# Patient Record
Sex: Male | Born: 1995 | Race: Black or African American | Hispanic: No | Marital: Single | Smoking: Current every day smoker
Health system: Southern US, Community
[De-identification: ages and names within clinical notes are randomized; demographics above are authoritative.]

---

## 2019-05-17 ENCOUNTER — Encounter: Payer: Self-pay | Admitting: Emergency Medicine

## 2019-05-17 ENCOUNTER — Emergency Department
Admission: EM | Admit: 2019-05-17 | Discharge: 2019-05-17 | Disposition: A | Discharge status: Home / Self Care | Payer: Self-pay | Attending: Emergency Medicine | Admitting: Emergency Medicine

## 2019-05-17 ENCOUNTER — Emergency Department: Payer: Self-pay

## 2019-05-17 ENCOUNTER — Other Ambulatory Visit: Payer: Self-pay

## 2019-05-17 DIAGNOSIS — F17200 Nicotine dependence, unspecified, uncomplicated: Secondary | ICD-10-CM | POA: Insufficient documentation

## 2019-05-17 DIAGNOSIS — S93402A Sprain of unspecified ligament of left ankle, initial encounter: Secondary | ICD-10-CM | POA: Insufficient documentation

## 2019-05-17 DIAGNOSIS — X509XXA Other and unspecified overexertion or strenuous movements or postures, initial encounter: Secondary | ICD-10-CM | POA: Insufficient documentation

## 2019-05-17 DIAGNOSIS — Y9367 Activity, basketball: Secondary | ICD-10-CM | POA: Insufficient documentation

## 2019-05-17 DIAGNOSIS — Y999 Unspecified external cause status: Secondary | ICD-10-CM | POA: Insufficient documentation

## 2019-05-17 DIAGNOSIS — Y929 Unspecified place or not applicable: Secondary | ICD-10-CM | POA: Insufficient documentation

## 2019-05-17 DIAGNOSIS — Y92310 Basketball court as the place of occurrence of the external cause: Secondary | ICD-10-CM | POA: Insufficient documentation

## 2019-05-17 MED ORDER — TRAMADOL HCL 50 MG PO TABS: 50.0000 mg | ORAL_TABLET | Freq: Four times a day (QID) | ORAL | 0 refills | Status: AC | PRN

## 2019-05-17 MED ORDER — NAPROXEN 500 MG PO TABS: 500.0000 mg | ORAL_TABLET | Freq: Two times a day (BID) | ORAL | 0 refills | Status: AC

## 2019-05-17 NOTE — Discharge Instructions (Signed)
Follow-up with your primary care provider or Dr. Roland Rack who is on-call for orthopedics if any continued problems with your left ankle.  Ice and elevation for the next several days to reduce swelling which will help with pain.  Wear ankle splint for support and protection.  Use crutches when weightbearing until you are able to bear weight without pain.  Begin taking naproxen 500 mg twice daily with food.  Tramadol if needed for pain.  Do not drive or operate machinery while taking tramadol as it could cause drowsiness and increase your risk for injury.

## 2019-05-17 NOTE — ED Provider Notes (Signed)
[facility] Emergency Department Provider Note  ____________________________________________   First MD Initiated Contact with Patient 05/17/19 [phone]     (approximate)  I have reviewed the triage vital signs and the nursing notes.   HISTORY  Chief Complaint Ankle Pain   HPI Juan ClossDeontae Beaston is a 23 y.o. male presents to the ED with complaint of left ankle pain.  Patient states he was playing basketball yesterday and felt a pop and has continued to have swelling and pain since that time.  This morning there is increased pain with weightbearing.  He has taken Tylenol for his pain.  He rates his pain as a 9/10.      History reviewed. No pertinent past medical history.  There are no active problems to display for this patient.   History reviewed. No pertinent surgical history.  Prior to Admission medications   Medication Sig Start Date End Date Taking? Authorizing Provider  naproxen (NAPROSYN) 500 MG tablet Take 1 tablet (500 mg total) by mouth 2 (two) times daily with a meal. 05/17/19   Tommi RumpsSummers, Samit Sylve L, PA-C  traMADol (ULTRAM) 50 MG tablet Take 1 tablet (50 mg total) by mouth every 6 (six) hours as needed. 05/17/19   Tommi RumpsSummers, Kairen Hallinan L, PA-C    Allergies Patient has no known allergies.  No family history on file.  Social History Social History   Tobacco Use  . Smoking status: Current Every Day Smoker  . Smokeless tobacco: Never Used  Substance Use Topics  . Alcohol use: Not Currently  . Drug use: Not on file    Review of Systems Constitutional: No fever/chills Cardiovascular: Denies chest pain. Respiratory: Denies shortness of breath. Gastrointestinal: No abdominal pain.  No nausea, no vomiting.  Musculoskeletal: Positive left ankle pain. Skin: Negative for rash. Neurological: Negative for headaches, focal weakness or numbness. ___________________________________________   PHYSICAL EXAM:  VITAL SIGNS: ED Triage Vitals  Enc Vitals  Group     BP 05/17/19 0848 (!) 151/67     Pulse Rate 05/17/19 0848 61     Resp 05/17/19 0848 18     Temp 05/17/19 0848 97.9 F (36.6 C)     Temp Source 05/17/19 0848 Oral     SpO2 05/17/19 0848 99 %     Weight 05/17/19 0842 230 lb (104.3 kg)     Height 05/17/19 0842 6\' 2"  (1.88 m)     Head Circumference --      Peak Flow --      Pain Score 05/17/19 0842 9     Pain Loc --      Pain Edu? --      Excl. in [place]? --    Constitutional: Alert and oriented. Well appearing and in no acute distress. Eyes: Conjunctivae are normal.  Head: Atraumatic. Neck: No stridor.   Cardiovascular: Normal rate, regular rhythm. Grossly normal heart sounds.  Good peripheral circulation. Respiratory: Normal respiratory effort.  No retractions. Lungs CTAB. Musculoskeletal: On examination of the left ankle there is moderate soft tissue edema present on the lateral aspect.  Range of motion is restricted secondary to discomfort.  Skin is intact with no ecchymosis or abrasions seen.  Patient is able to move digits without any difficulty.  Motor sensory function intact.  Capillary refills less than 3 seconds.  Limping gait was noted to the exam room. Neurologic:  Normal speech and language. No gross focal neurologic deficits are appreciated. Skin:  Skin is warm, dry and intact. No rash noted. Psychiatric: Mood  and affect are normal. Speech and behavior are normal.  ____________________________________________   LABS (all labs ordered are listed, but only abnormal results are displayed)  Labs Reviewed - No data to display  RADIOLOGY  ED MD interpretation:  Left ankle x-ray is negative for acute bony injury.  Official radiology report(s): Dg Ankle Complete Left  Result Date: 05/17/2019 CLINICAL DATA:  Twisting injury playing basketball yesterday. Lateral pain. EXAM: LEFT ANKLE COMPLETE - 3+ VIEW COMPARISON:  None. FINDINGS: The mineralization and alignment are normal. There is no evidence of acute fracture or  dislocation. The joint spaces are preserved. There is moderate lateral soft tissue swelling. IMPRESSION: Moderate lateral soft tissue swelling without evidence of acute fracture or dislocation. Electronically Signed   By: Richardean Sale M.D.   On: 05/17/2019 09:11    ____________________________________________   PROCEDURES  Procedure(s) performed (including Critical Care):  Procedures  Ace wrap and ankle stirrup splint was applied by RN.  Patient was given crutches. ____________________________________________   INITIAL IMPRESSION / ASSESSMENT AND PLAN / ED COURSE  As part of my medical decision making, I reviewed the following data within the electronic MEDICAL RECORD NUMBER Notes from prior ED visits and [place] Controlled Substance Database  23 year old male presents to the ED with complaint of left ankle pain and swelling since yesterday while playing basketball.  Patient reports he heard a pop and has continued to have pain and swelling since that time.  Was noted to have a limping gait.  X-rays were negative for acute bony injury.  Patient was placed in an Ace wrap and a stirrup ankle splint.  He was given crutches.  He was encouraged to ice and elevate.  He is to follow-up with Dr. Roland Rack if any continued problems.  He was discharged with a prescription for naproxen 500 mg twice daily and tramadol every 6 hours as needed for pain.  ____________________________________________   FINAL CLINICAL IMPRESSION(S) / ED DIAGNOSES  Final diagnoses:  Sprain of left ankle, unspecified ligament, initial encounter     ED Discharge Orders         Ordered    naproxen (NAPROSYN) 500 MG tablet  2 times daily with meals     05/17/19 0932    traMADol (ULTRAM) 50 MG tablet  Every 6 hours PRN     05/17/19 0932           Note:  This document was prepared using Dragon voice recognition software and may include unintentional dictation errors.    [name], PA-C 05/17/19 1149     Earleen Newport, MD 05/17/19 [phone]

## 2019-05-17 NOTE — ED Triage Notes (Signed)
Injured left ankle yesterday.

## 2019-09-22 ENCOUNTER — Encounter: Payer: Self-pay | Admitting: Emergency Medicine

## 2019-09-22 DIAGNOSIS — Z20828 Contact with and (suspected) exposure to other viral communicable diseases: Secondary | ICD-10-CM | POA: Insufficient documentation

## 2019-09-22 DIAGNOSIS — Z791 Long term (current) use of non-steroidal anti-inflammatories (NSAID): Secondary | ICD-10-CM | POA: Insufficient documentation

## 2019-09-22 DIAGNOSIS — J039 Acute tonsillitis, unspecified: Secondary | ICD-10-CM | POA: Insufficient documentation

## 2019-09-22 DIAGNOSIS — F17210 Nicotine dependence, cigarettes, uncomplicated: Secondary | ICD-10-CM | POA: Insufficient documentation

## 2019-09-22 NOTE — ED Triage Notes (Signed)
Pt reports he started to have sore throat and swelling to the tonsil areas (bilateral) on Monday. Pt sts, "I know I have tonsillitis cause I have had it before." Both tonsils are swollen and red. Pt denies fever.

## 2019-09-23 ENCOUNTER — Emergency Department
Admission: EM | Admit: 2019-09-23 | Discharge: 2019-09-23 | Disposition: A | Discharge status: Home / Self Care | Payer: Medicaid Other | Attending: Emergency Medicine | Admitting: Emergency Medicine

## 2019-09-23 DIAGNOSIS — J039 Acute tonsillitis, unspecified: Secondary | ICD-10-CM

## 2019-09-23 LAB — POC SARS CORONAVIRUS 2 AG: SARS Coronavirus 2 Ag: NEGATIVE

## 2019-09-23 LAB — GROUP A STREP BY PCR: Group A Strep by PCR: NOT DETECTED

## 2019-09-23 MED ORDER — AMOXICILLIN-POT CLAVULANATE 875-125 MG PO TABS: 1.0000 | ORAL_TABLET | Freq: Two times a day (BID) | ORAL | 0 refills | Status: AC

## 2019-09-23 MED ORDER — AMOXICILLIN-POT CLAVULANATE 875-125 MG PO TABS
1.0000 | ORAL_TABLET | Freq: Once | ORAL | Status: AC
  Administered 2019-09-23: 02:00:00 1 via ORAL
  Filled 2019-09-23: qty 1

## 2019-09-23 MED ORDER — PREDNISONE 20 MG PO TABS: 60.0000 mg | ORAL_TABLET | Freq: Every day | ORAL | 0 refills | Status: AC

## 2019-09-23 MED ORDER — PREDNISONE 20 MG PO TABS
60.0000 mg | ORAL_TABLET | Freq: Once | ORAL | Status: AC
  Administered 2019-09-23: 60 mg via ORAL
  Filled 2019-09-23: qty 3

## 2019-09-23 NOTE — ED Provider Notes (Signed)
[facility] Emergency Department Provider Note  ____________________________________________   First MD Initiated Contact with Patient 09/23/19 0018     (approximate)  I have reviewed the triage vital signs and the nursing notes.   HISTORY  Chief Complaint Oral Swelling and Sore Throat    HPI Juan Bryant is a 23 y.o. male with past medical history of tonsillitis presents to the emergency department secondary to sore throat and bilateral tonsillar swelling x3 days.  Patient denies any fever.  Patient denies any cough.  Patient states that symptoms consistent with previous episodes of tonsillitis.        History reviewed. No pertinent past medical history.  There are no problems to display for this patient.   History reviewed. No pertinent surgical history.  Prior to Admission medications   Medication Sig Start Date End Date Taking? Authorizing Provider  amoxicillin-clavulanate (AUGMENTIN) 875-125 MG tablet Take 1 tablet by mouth every 12 (twelve) hours for 10 days. 09/23/19 10/03/19  Gregor Hams, MD  naproxen (NAPROSYN) 500 MG tablet Take 1 tablet (500 mg total) by mouth 2 (two) times daily with a meal. 05/17/19   [name], PA-C  predniSONE (DELTASONE) 20 MG tablet Take 3 tablets (60 mg total) by mouth daily for 5 days. 09/23/19 09/28/19  Gregor Hams, MD  traMADol (ULTRAM) 50 MG tablet Take 1 tablet (50 mg total) by mouth every 6 (six) hours as needed. 05/17/19   [name], PA-C    Allergies Patient has no known allergies.  History reviewed. No pertinent family history.  Social History Social History   Tobacco Use  . Smoking status: Current Every Day Smoker  . Smokeless tobacco: Never Used  Substance Use Topics  . Alcohol use: Not Currently  . Drug use: Not on file    Review of Systems Constitutional: No fever/chills Eyes: No visual changes. ENT: Positive for sore throat. Cardiovascular: Denies chest  pain. Respiratory: Denies shortness of breath. Gastrointestinal: No abdominal pain.  No nausea, no vomiting.  No diarrhea.  No constipation. Genitourinary: Negative for dysuria. Musculoskeletal: Negative for neck pain.  Negative for back pain. Integumentary: Negative for rash. Neurological: Negative for headaches, focal weakness or numbness.   ____________________________________________   PHYSICAL EXAM:  VITAL SIGNS: ED Triage Vitals [09/22/19 2258]  Enc Vitals Group     BP (!) 150/76     Pulse Rate 78     Resp 18     Temp 99.8 F (37.7 C)     Temp Source Oral     SpO2 100 %     Weight      Height      Head Circumference      Peak Flow      Pain Score      Pain Loc      Pain Edu?      Excl. in [place]?     Constitutional: Alert and oriented.  Eyes: Conjunctivae are normal.  Nose: No congestion/rhinnorhea. Mouth/Throat: Patient is wearing a mask. Neck: No stridor.  Pharyngeal erythema no exudate noted Cardiovascular: Normal rate, regular rhythm. Good peripheral circulation. Grossly normal heart sounds. Respiratory: Normal respiratory effort.  No retractions. Gastrointestinal: Soft and nontender. No distention.   Musculoskeletal: No lower extremity tenderness nor edema. No gross deformities of extremities. Neurologic:  Normal speech and language. No gross focal neurologic deficits are appreciated.  Skin:  Skin is warm, dry and intact. Psychiatric: Mood and affect are normal. Speech and behavior are normal.  ____________________________________________   LABS (all labs ordered are listed, but only abnormal results are displayed)  Labs Reviewed  GROUP A STREP BY PCR  POC SARS CORONAVIRUS 2 AG -  ED  POC SARS CORONAVIRUS 2 AG    No results found.  Procedures   ____________________________________________   INITIAL IMPRESSION / MDM / ASSESSMENT AND PLAN / ED COURSE  As part of my medical decision making, I reviewed the following data within the electronic  MEDICAL RECORD NUMBER   23 year old male presented with above-stated history and physical exam consistent with tonsillitis without any evidence of peritonsillar abscess.  Patient given Augmentin and prednisone in the emergency department will be prescribed the same for home.  Covid testing was performed which was negative group A strep was also performed which was also negative.  Patient handling oral secretions without any difficulty.  Spoke with the patient at length regarding warning signs that would warrant immediate return to the emergency department.      ____________________________________________  FINAL CLINICAL IMPRESSION(S) / ED DIAGNOSES  Final diagnoses:  Tonsillitis     MEDICATIONS GIVEN DURING THIS VISIT:  Medications  predniSONE (DELTASONE) tablet 60 mg (has no administration in time range)  amoxicillin-clavulanate (AUGMENTIN) 875-125 MG per tablet 1 tablet (has no administration in time range)     ED Discharge Orders         Ordered    amoxicillin-clavulanate (AUGMENTIN) 875-125 MG tablet  Every 12 hours     09/23/19 0126    predniSONE (DELTASONE) 20 MG tablet  Daily     09/23/19 0126          *Please note:  Juan Bryant was evaluated in Emergency Department on 09/23/2019 for the symptoms described in the history of present illness. He was evaluated in the context of the global COVID-19 pandemic, which necessitated consideration that the patient might be at risk for infection with the SARS-CoV-2 virus that causes COVID-19. Institutional protocols and algorithms that pertain to the evaluation of patients at risk for COVID-19 are in a state of rapid change based on information released by regulatory bodies including the CDC and federal and state organizations. These policies and algorithms were followed during the patient's care in the ED.  Some ED evaluations and interventions may be delayed as a result of limited staffing during the pandemic.*  Note:  This document  was prepared using Dragon voice recognition software and may include unintentional dictation errors.   Darci Current, MD 09/23/19 [phone]

## 2020-01-19 ENCOUNTER — Encounter: Payer: Self-pay | Admitting: *Deleted

## 2020-01-19 ENCOUNTER — Emergency Department
Admission: EM | Admit: 2020-01-19 | Discharge: 2020-01-19 | Disposition: A | Discharge status: Home / Self Care | Payer: Medicaid Other | Attending: Emergency Medicine | Admitting: Emergency Medicine

## 2020-01-19 ENCOUNTER — Emergency Department: Payer: Medicaid Other

## 2020-01-19 ENCOUNTER — Other Ambulatory Visit: Payer: Self-pay

## 2020-01-19 DIAGNOSIS — Z79899 Other long term (current) drug therapy: Secondary | ICD-10-CM | POA: Insufficient documentation

## 2020-01-19 DIAGNOSIS — Z791 Long term (current) use of non-steroidal anti-inflammatories (NSAID): Secondary | ICD-10-CM | POA: Insufficient documentation

## 2020-01-19 DIAGNOSIS — F17210 Nicotine dependence, cigarettes, uncomplicated: Secondary | ICD-10-CM | POA: Insufficient documentation

## 2020-01-19 DIAGNOSIS — R002 Palpitations: Secondary | ICD-10-CM | POA: Insufficient documentation

## 2020-01-19 DIAGNOSIS — R0789 Other chest pain: Secondary | ICD-10-CM

## 2020-01-19 LAB — BASIC METABOLIC PANEL
Anion gap: 8 (ref 5–15)
BUN: 11 mg/dL (ref 6–20)
CO2: 26 mmol/L (ref 22–32)
Calcium: 9.5 mg/dL (ref 8.9–10.3)
Chloride: 105 mmol/L (ref 98–111)
Creatinine, Ser: 1.09 mg/dL (ref 0.61–1.24)
GFR calc Af Amer: >60 mL/min (ref >60)
GFR calc non Af Amer: >60 mL/min (ref >60)
Glucose, Bld: 98 mg/dL (ref 70–99)
Potassium: 4.2 mmol/L (ref 3.5–5.1)
Sodium: 139 mmol/L (ref 135–145)

## 2020-01-19 LAB — CBC
HCT: 40.4 % (ref 39.0–52.0)
Hemoglobin: 14.2 g/dL (ref 13.0–17.0)
MCH: 32.5 pg (ref 26.0–34.0)
MCHC: 35.1 g/dL (ref 30.0–36.0)
MCV: 92.4 fL (ref 80.0–100.0)
Platelets: 245 10*3/uL (ref 150–400)
RBC: 4.37 MIL/uL (ref 4.22–5.81)
RDW: 12.8 % (ref 11.5–15.5)
WBC: 7.7 10*3/uL (ref 4.0–10.5)
nRBC: 0 % (ref 0.0–0.2)

## 2020-01-19 LAB — TROPONIN I (HIGH SENSITIVITY): Troponin I (High Sensitivity): 3 ng/L (ref <18)

## 2020-01-19 MED ORDER — SUCRALFATE 1 G PO TABS: 1.0000 g | ORAL_TABLET | Freq: Three times a day (TID) | ORAL | 0 refills | Status: AC

## 2020-01-19 MED ORDER — LIDOCAINE VISCOUS HCL 2 % MT SOLN
15.0000 mL | Freq: Once | OROMUCOSAL | Status: AC
  Administered 2020-01-19: 19:00:00 15 mL via ORAL
  Filled 2020-01-19: qty 15

## 2020-01-19 MED ORDER — ALUM & MAG HYDROXIDE-SIMETH 200-200-20 MG/5ML PO SUSP
30.0000 mL | Freq: Once | ORAL | Status: AC
  Administered 2020-01-19: 19:00:00 30 mL via ORAL
  Filled 2020-01-19: qty 30

## 2020-01-19 MED ORDER — SODIUM CHLORIDE 0.9% FLUSH: 3.0000 mL | Freq: Once | INTRAVENOUS | Status: DC

## 2020-01-19 MED ORDER — PANTOPRAZOLE SODIUM 40 MG PO TBEC: 40.0000 mg | DELAYED_RELEASE_TABLET | Freq: Every day | ORAL | 0 refills | Status: AC

## 2020-01-19 NOTE — Discharge Instructions (Addendum)
Your symptoms of palpitations are actually likely related to your stomach and acid reflux.  START taking the prescribed antacids  STOP smoking  AVOID foods high in fat, fried foods, or foods with high acid content or spice  MINIMIZE any eating or meals after 8 PM  If your symptoms do not improve, call to set up a CARDIOLOGY appointment in the next 1-2 weeks  AVOID alcohol

## 2020-01-19 NOTE — ED Triage Notes (Signed)
Pt ambulatory to triage.  Pt reports chest pain and sob for 2 weeks.  Reports heart beating fast at times.  No n/v/   Sob with exertion.  Pt alert  Speech clear.  Seen at [facility] recently for similar sx.

## 2020-01-19 NOTE — ED Provider Notes (Signed)
[facility] Emergency Department Provider Note  ____________________________________________   First MD Initiated Contact with Patient 01/19/20 1805     (approximate)  I have reviewed the triage vital signs and the nursing notes.   HISTORY  Chief Complaint Chest Pain    HPI Juan Bryant is a 24 y.o. male here with intermittent palpitations.  The patient states that off and on for the last several weeks, he has had episodes in which he begins to feel like he has palpitations and like his heart is beating funny.  He has an associated mild pressure-like sensation in his epigastric and substernal area.  The symptoms seem to come and go randomly.  He does notice they are slightly worse at night and after eating a large meal.  He also has some occasional acid taste in his mouth with these.  He states that when he feels a feeling, he does have some mild shortness of breath.  He is taking his pulse during the episodes and has not noticed that it has been fast or irregular.  He has a history of what he reports is possible abnormal rhythm as a child, but this was cleared by cardiology.  No personal family history of early heart disease or arrhythmia.        No past medical history on file.  There are no problems to display for this patient.   No past surgical history on file.  Prior to Admission medications   Medication Sig Start Date End Date Taking? Authorizing Provider  naproxen (NAPROSYN) 500 MG tablet Take 1 tablet (500 mg total) by mouth 2 (two) times daily with a meal. 05/17/19   Letitia Neri L, PA-C  pantoprazole (PROTONIX) 40 MG tablet Take 1 tablet (40 mg total) by mouth daily for 14 days. 01/19/20 02/02/20  Duffy Bruce, MD  sucralfate (CARAFATE) 1 g tablet Take 1 tablet (1 g total) by mouth 4 (four) times daily -  with meals and at bedtime for 5 days. 01/19/20 01/24/20  Duffy Bruce, MD  traMADol (ULTRAM) 50 MG tablet Take 1 tablet (50 mg total) by  mouth every 6 (six) hours as needed. 05/17/19   [name], PA-C    Allergies Patient has no known allergies.  No family history on file.  Social History Social History   Tobacco Use  . Smoking status: Current Every Day Smoker  . Smokeless tobacco: Never Used  Substance Use Topics  . Alcohol use: Not Currently  . Drug use: Not on file    Review of Systems  Review of Systems  Constitutional: Negative for chills, fatigue and fever.  HENT: Negative for sore throat.   Respiratory: Negative for shortness of breath.   Cardiovascular: Positive for palpitations. Negative for chest pain.  Gastrointestinal: Positive for nausea. Negative for abdominal pain.  Genitourinary: Negative for flank pain.  Musculoskeletal: Negative for neck pain.  Skin: Negative for rash and wound.  Allergic/Immunologic: Negative for immunocompromised state.  Neurological: Negative for weakness and numbness.  Hematological: Does not bruise/bleed easily.  All other systems reviewed and are negative.    ____________________________________________  PHYSICAL EXAM:      VITAL SIGNS: ED Triage Vitals  Enc Vitals Group     BP 01/19/20 1601 (!) 154/85     Pulse Rate 01/19/20 1601 63     Resp 01/19/20 1601 18     Temp 01/19/20 1601 98.9 F (37.2 C)     Temp Source 01/19/20 1601 Oral  SpO2 01/19/20 1601 99 %     Weight 01/19/20 1558 260 lb (117.9 kg)     Height 01/19/20 1558 6\' 2"  (1.88 m)     Head Circumference --      Peak Flow --      Pain Score 01/19/20 1558 5     Pain Loc --      Pain Edu? --      Excl. in [place]? --      Physical Exam Vitals and nursing note reviewed.  Constitutional:      General: He is not in acute distress.    Appearance: He is well-developed.  HENT:     Head: Normocephalic and atraumatic.  Eyes:     Conjunctiva/sclera: Conjunctivae normal.  Cardiovascular:     Rate and Rhythm: Normal rate and regular rhythm.     Heart sounds: Normal heart sounds. No murmur.  No friction rub.  Pulmonary:     Effort: Pulmonary effort is normal. No respiratory distress.     Breath sounds: Normal breath sounds. No wheezing or rales.  Abdominal:     General: There is no distension.     Palpations: Abdomen is soft.     Tenderness: There is no abdominal tenderness.     Comments: Mild, epigastric  Musculoskeletal:     Cervical back: Neck supple.  Skin:    General: Skin is warm.     Capillary Refill: Capillary refill takes less than 2 seconds.  Neurological:     Mental Status: He is alert and oriented to person, place, and time.     Motor: No abnormal muscle tone.       ____________________________________________   LABS (all labs ordered are listed, but only abnormal results are displayed)  Labs Reviewed  BASIC METABOLIC PANEL  CBC  TROPONIN I (HIGH SENSITIVITY)  TROPONIN I (HIGH SENSITIVITY)    ____________________________________________  EKG: Normal sinus rhythm, ventricular rate 61.  PR 148, QRS 104, QTc 390.  Occasional PACs noted on second EKG.  First EKG is unremarkable.  No evidence of acute ischemia or infarct. ________________________________________  RADIOLOGY All imaging, including plain films, CT scans, and ultrasounds, independently reviewed by me, and interpretations confirmed via formal radiology reads.  ED MD interpretation:   Chest x-ray: Clear  Official radiology report(s): DG Chest 2 View  Result Date: 01/19/2020 CLINICAL DATA:  Chest pain and shortness of breath EXAM: CHEST - 2 VIEW COMPARISON:  None. FINDINGS: No consolidation, features of edema, pneumothorax, or effusion. Pulmonary vascularity is normally distributed. The cardiomediastinal contours are unremarkable. No acute osseous or soft tissue abnormality. Degenerative changes are present in the imaged spine and shoulders. IMPRESSION: No acute cardiopulmonary abnormality. Electronically Signed   By: 01/21/2020 M.D.   On: 01/19/2020 16:42     ____________________________________________  PROCEDURES   Procedure(s) performed (including Critical Care):  Procedures  ____________________________________________  INITIAL IMPRESSION / MDM / ASSESSMENT AND PLAN / ED COURSE  As part of my medical decision making, I reviewed the following data within the electronic MEDICAL RECORD NUMBER Nursing notes reviewed and incorporated, Old chart reviewed, Notes from prior ED visits, and [place] Controlled Substance Database       *Dee Maday was evaluated in Emergency Department on 01/19/2020 for the symptoms described in the history of present illness. He was evaluated in the context of the global COVID-19 pandemic, which necessitated consideration that the patient might be at risk for infection with the SARS-CoV-2 virus that causes COVID-19. Institutional protocols and algorithms  that pertain to the evaluation of patients at risk for COVID-19 are in a state of rapid change based on information released by regulatory bodies including the CDC and federal and state organizations. These policies and algorithms were followed during the patient's care in the ED.  Some ED evaluations and interventions may be delayed as a result of limited staffing during the pandemic.*     Medical Decision Making: 24 year old male here with occasional palpitations and epigastric pain.  This is been ongoing for weeks.  On arrival, EKG is nonischemic and he has no evidence to suggest significant ischemia clinically.  Troponin negative despite constant symptoms greater than 6 hours, making ACS highly unlikely.  Pain is not consistent with PE.  Of note, the symptoms seem to occur worse at night, occasionally related to eating, and are reproducible with epigastric palpation.  I suspect he may have a component of GERD with esophagitis which she is feeling his palpitations.  He had palpitation symptoms in the ED, with stable pulse on palpation and improvement with GI cocktail.  He  does have some occasional PACs on his repeat EKG, though I would not suspect this to cause significant symptoms.  Based on shared decision making, will treat with course of antacids.  If this does not improve his symptoms, will refer to cardiology for possible outpatient Holter monitoring.  ____________________________________________  FINAL CLINICAL IMPRESSION(S) / ED DIAGNOSES  Final diagnoses:  Atypical chest pain  Palpitations     MEDICATIONS GIVEN DURING THIS VISIT:  Medications  alum & mag hydroxide-simeth (MAALOX/MYLANTA) 200-200-20 MG/5ML suspension 30 mL (30 mLs Oral Given 01/19/20 1844)    And  lidocaine (XYLOCAINE) 2 % viscous mouth solution 15 mL (15 mLs Oral Given 01/19/20 1844)     ED Discharge Orders         Ordered    pantoprazole (PROTONIX) 40 MG tablet  Daily     01/19/20 2005    sucralfate (CARAFATE) 1 g tablet  3 times daily with meals & bedtime     01/19/20 2005           Note:  This document was prepared using Dragon voice recognition software and may include unintentional dictation errors.   Shaune Pollack, MD 01/19/20 2123

## 2021-03-01 IMAGING — DX LEFT ANKLE COMPLETE - 3+ VIEW
3 series · 3 of 3 positions shown · non-contrast
Comparison: None.

CLINICAL DATA: Twisting injury playing basketball yesterday.
Lateral pain.

EXAM:
LEFT ANKLE COMPLETE - 3+ VIEW

[ankle ap]
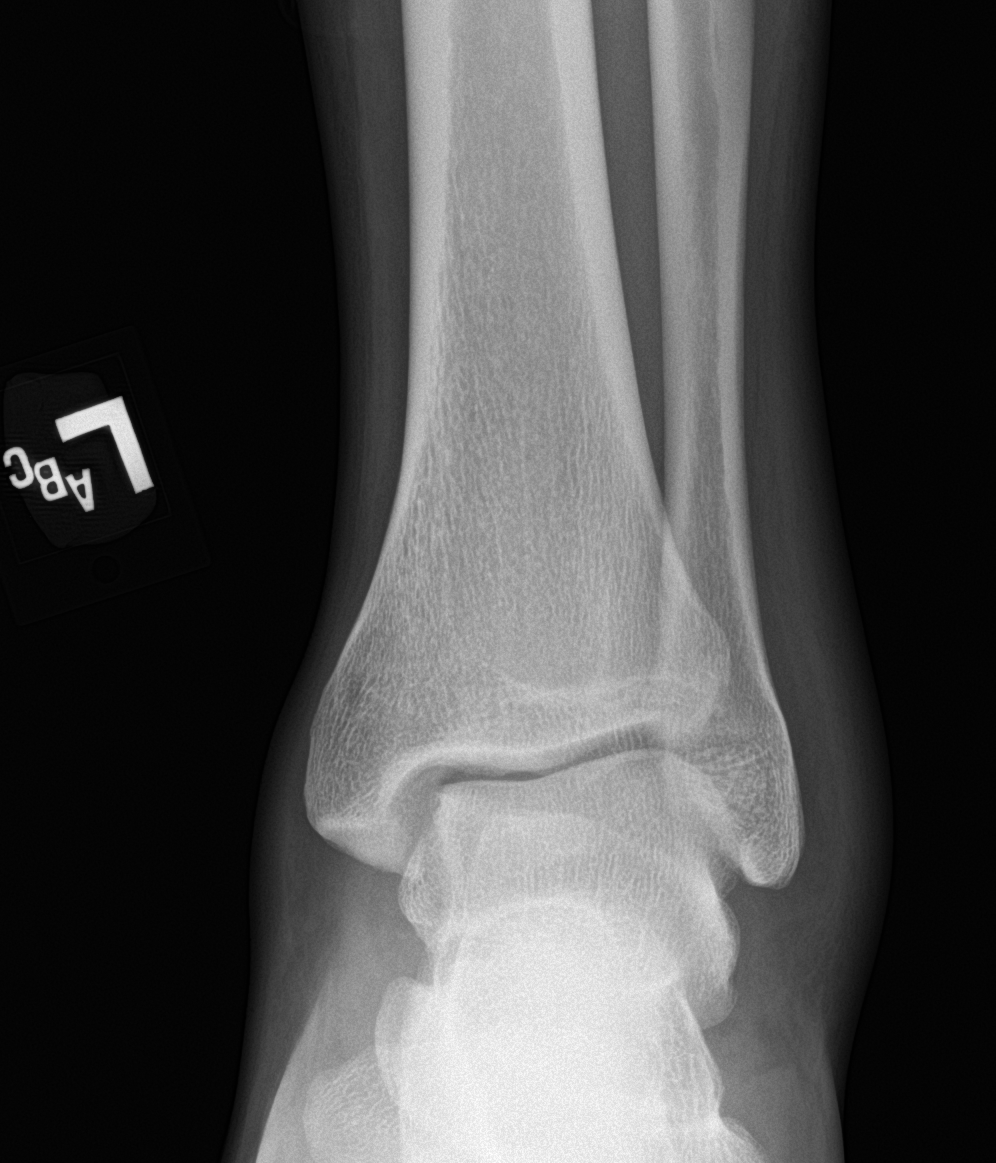

[ankle obl]
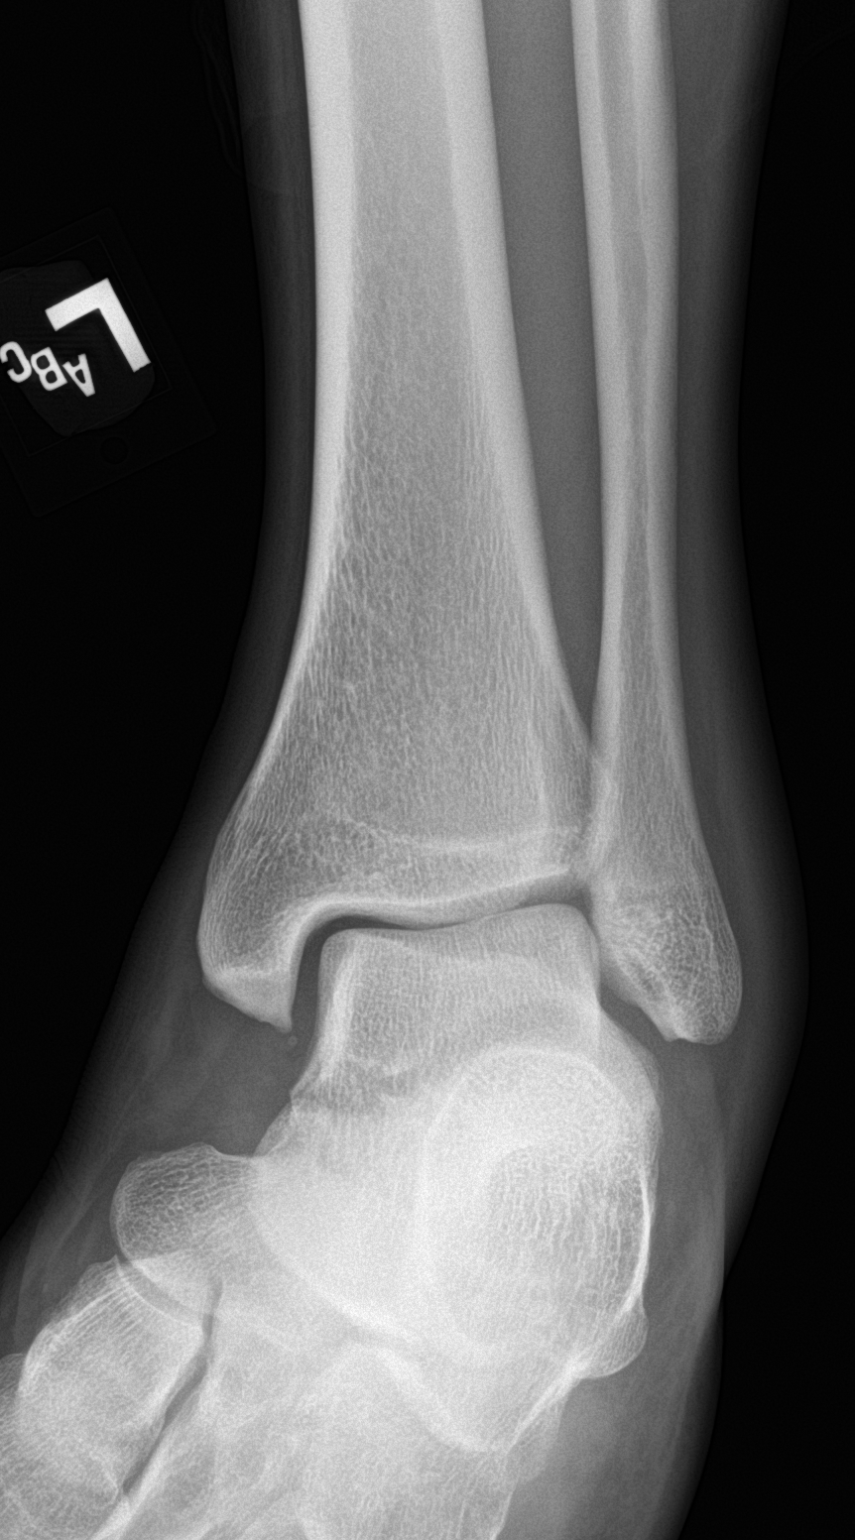

[ankle lat]
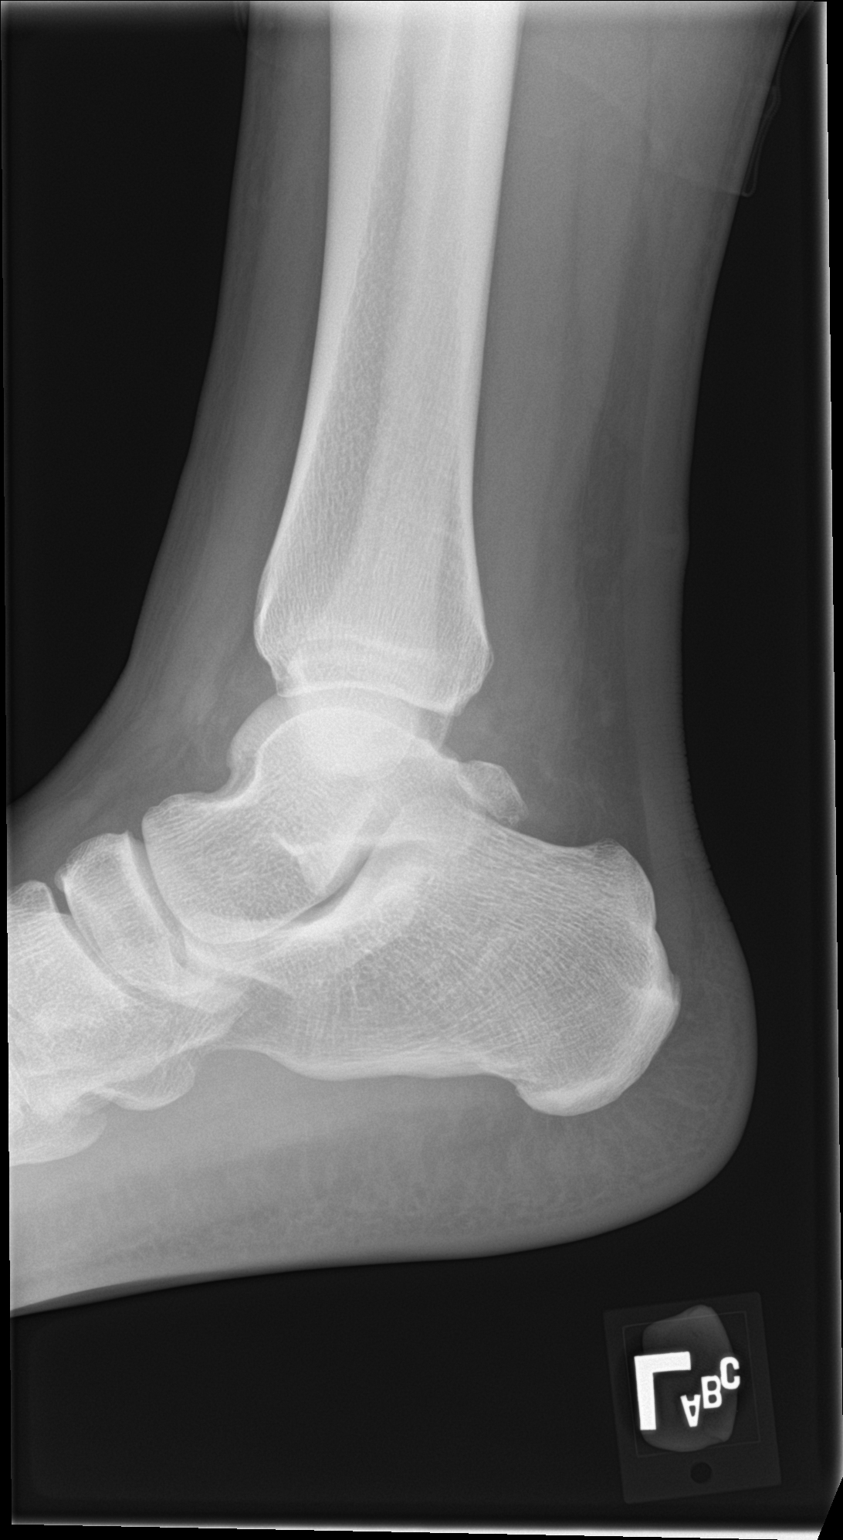

[3 of 3 positions shown; findings below may reference images not displayed]

FINDINGS: The mineralization and alignment are normal. There is no evidence of
acute fracture or dislocation. The joint spaces are preserved. There
is moderate lateral soft tissue swelling.
IMPRESSION: Moderate lateral soft tissue swelling without evidence of acute
fracture or dislocation.

## 2021-11-03 IMAGING — CR DG CHEST 2V
1 series · 2 of 2 positions shown · non-contrast
Comparison: None.

CLINICAL DATA: Chest pain and shortness of breath

EXAM:
CHEST - 2 VIEW

[Series 1: dg chest 2 view · 0.14mm/px · 2 of 2 slices shown]
[im 1/2]
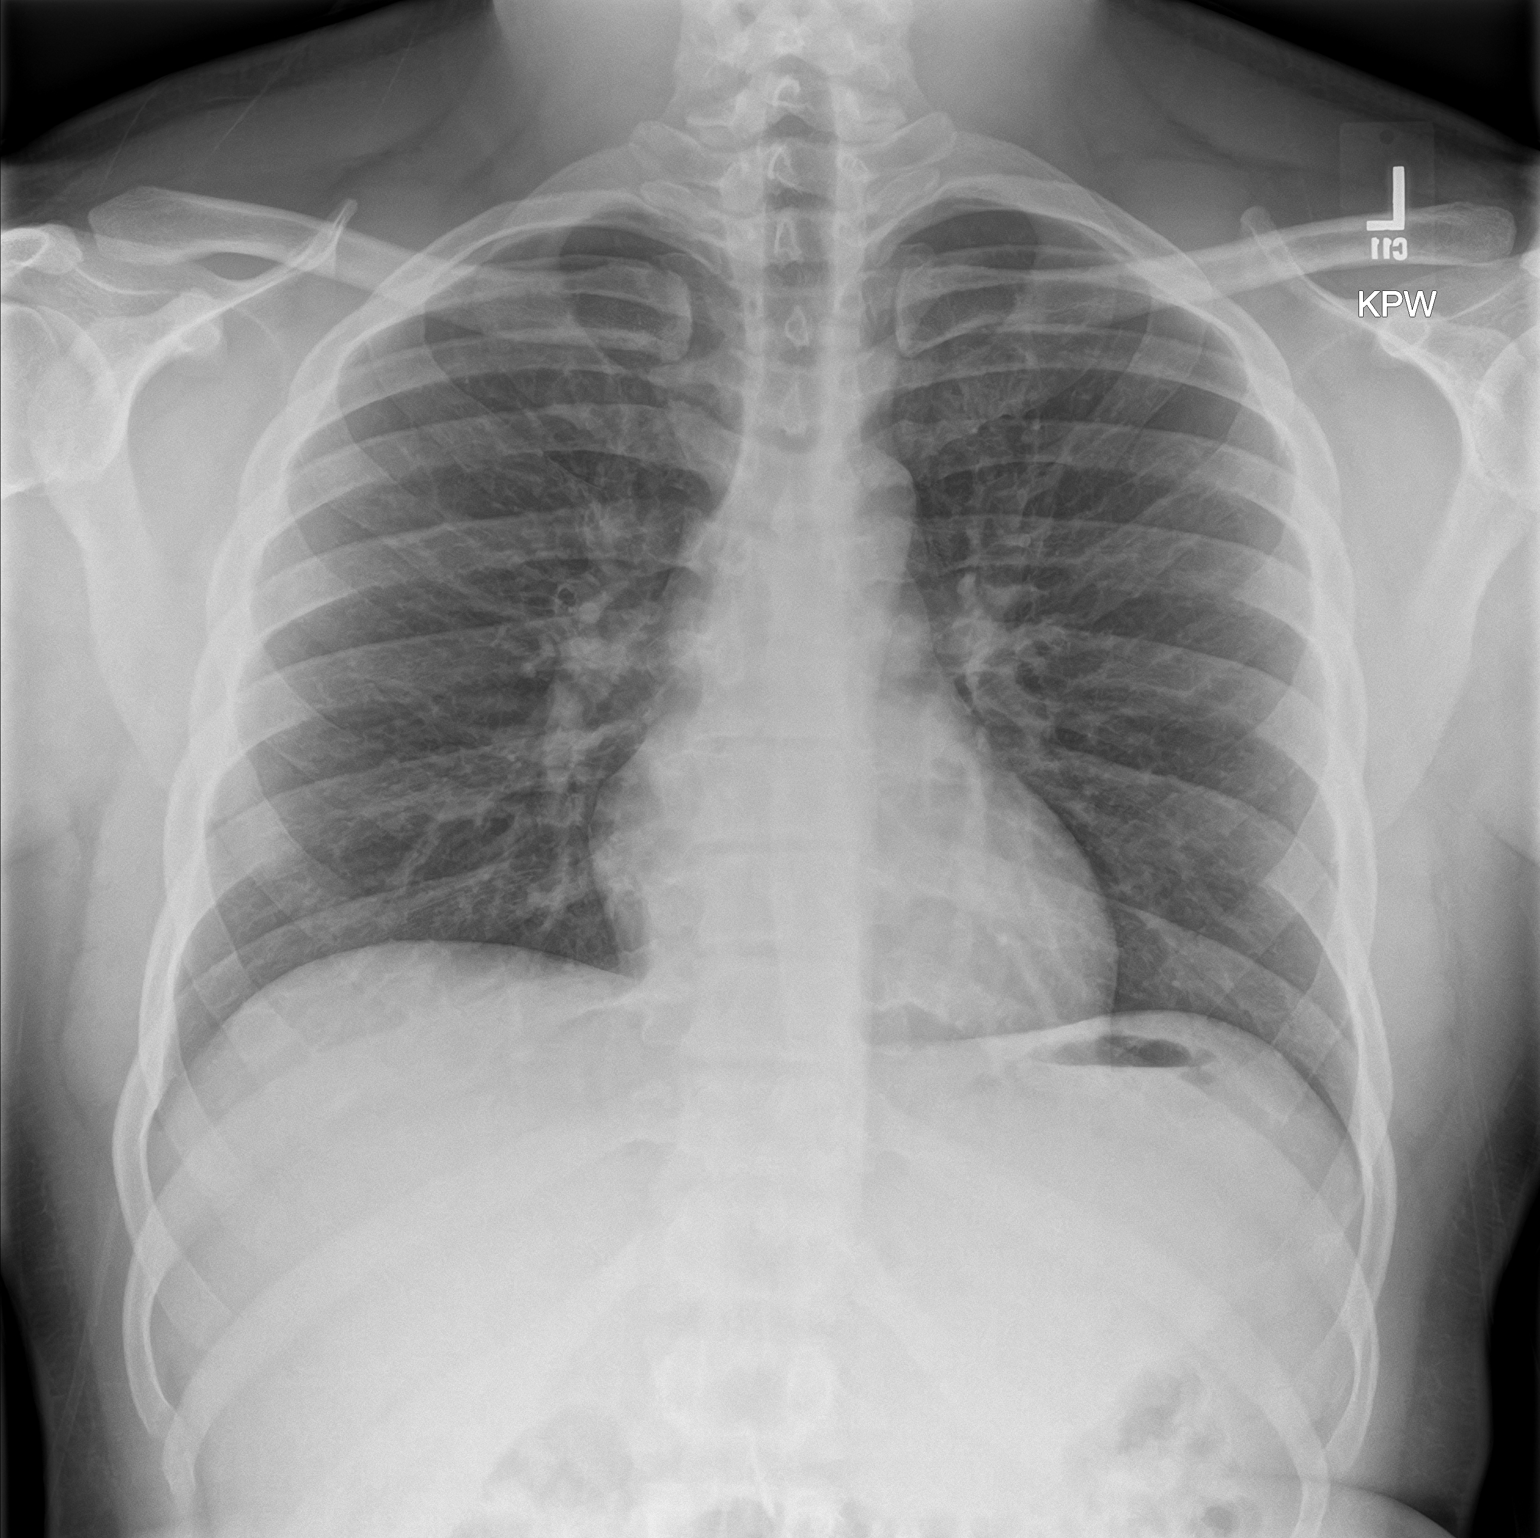
[im 2/2]
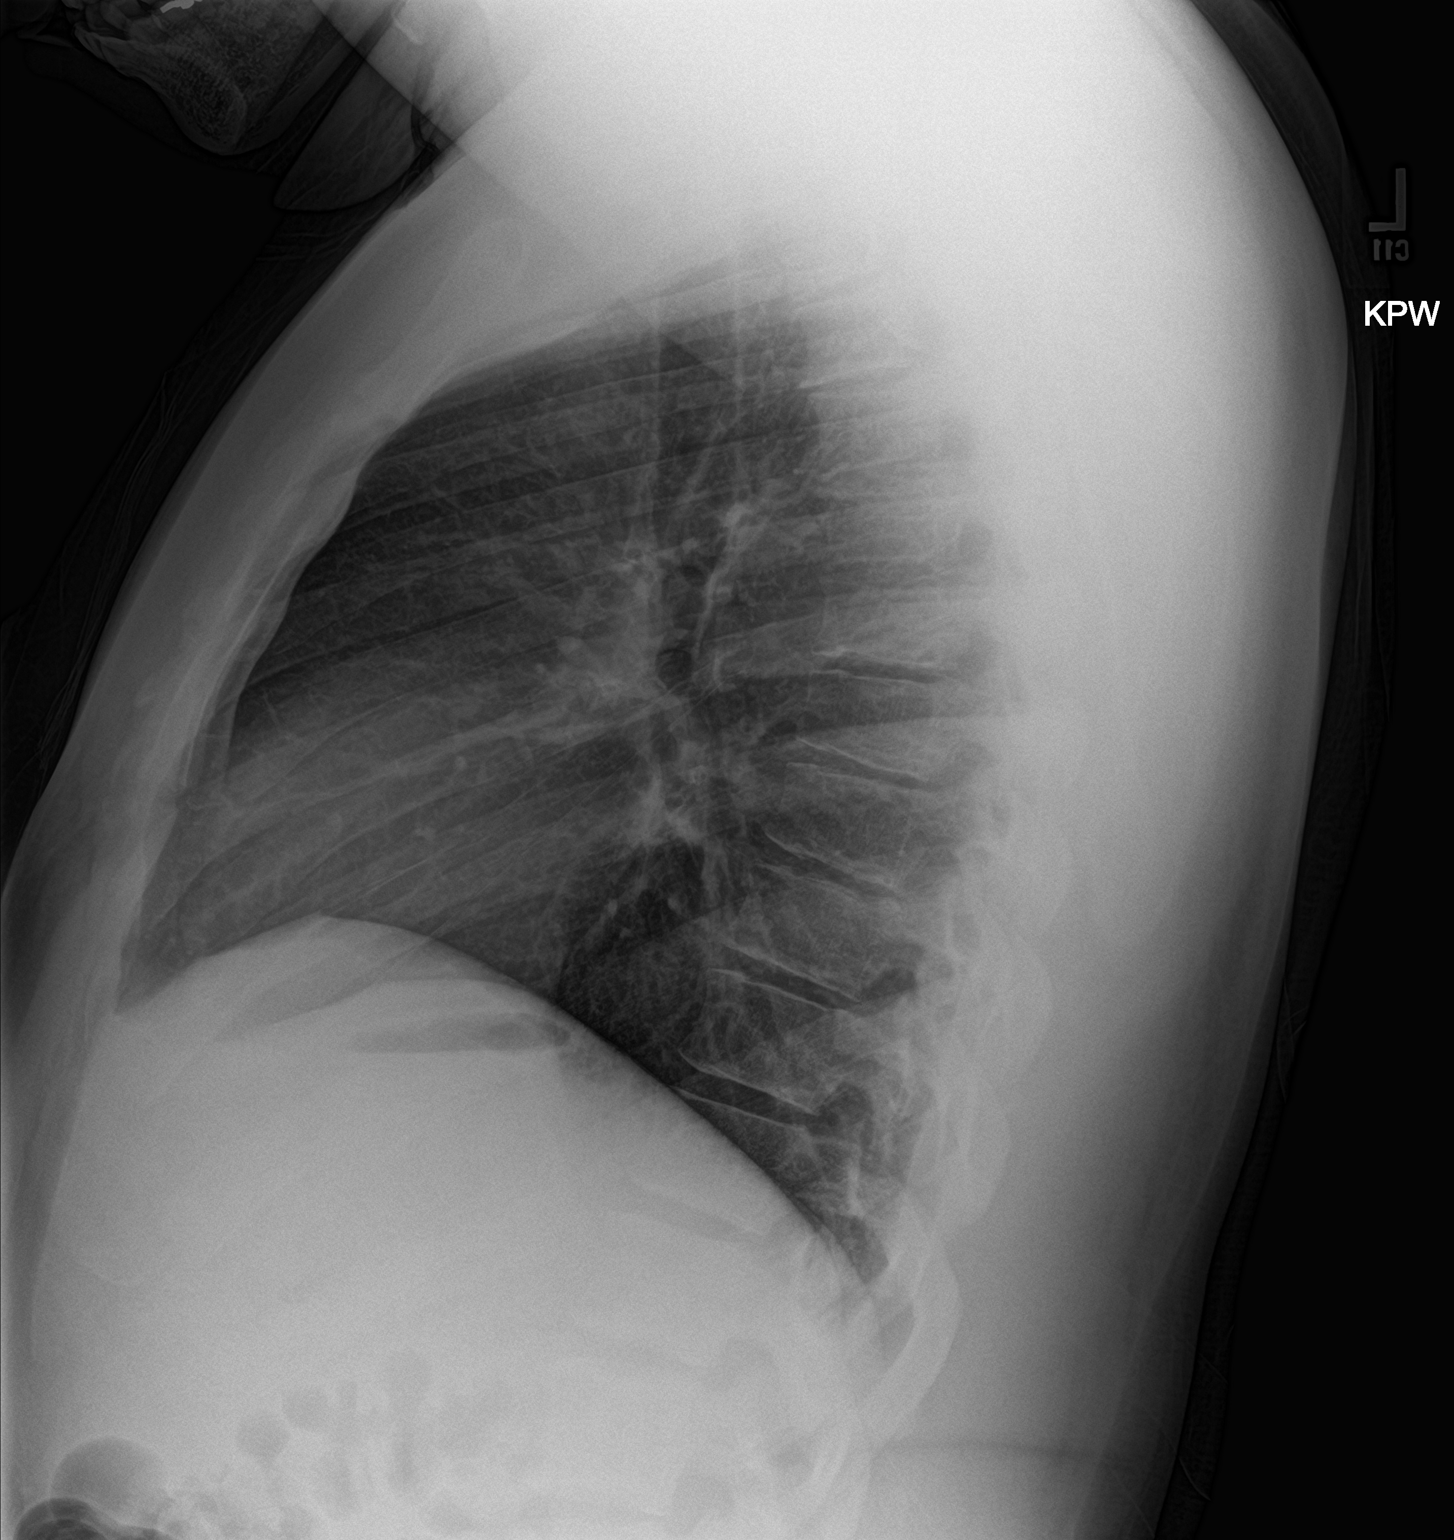

[2 of 2 positions shown; findings below may reference images not displayed]

FINDINGS: No consolidation, features of edema, pneumothorax, or effusion.
Pulmonary vascularity is normally distributed. The cardiomediastinal
contours are unremarkable. No acute osseous or soft tissue
abnormality. Degenerative changes are present in the imaged spine
and shoulders.
IMPRESSION: No acute cardiopulmonary abnormality.

## 2022-12-03 ENCOUNTER — Ambulatory Visit: Payer: Medicaid Other | Admitting: Family Medicine
